# Patient Record
Sex: Female | Born: 1950 | Race: White | Hispanic: No | Marital: Single
Health system: Southern US, Community
[De-identification: ages and names within clinical notes are randomized; demographics above are authoritative.]

---

## 2001-05-19 ENCOUNTER — Encounter: Payer: Self-pay | Admitting: Family Medicine

## 2001-05-19 ENCOUNTER — Ambulatory Visit (HOSPITAL_COMMUNITY): Admission: RE | Admit: 2001-05-19 | Discharge: 2001-05-19 | Payer: Self-pay | Admitting: Family Medicine

## 2001-08-17 ENCOUNTER — Encounter: Payer: Self-pay | Admitting: Family Medicine

## 2001-08-17 ENCOUNTER — Ambulatory Visit (HOSPITAL_COMMUNITY): Admission: RE | Admit: 2001-08-17 | Discharge: 2001-08-17 | Payer: Self-pay | Admitting: Family Medicine

## 2001-08-29 ENCOUNTER — Encounter: Payer: Self-pay | Admitting: Family Medicine

## 2001-08-29 ENCOUNTER — Ambulatory Visit (HOSPITAL_COMMUNITY): Admission: RE | Admit: 2001-08-29 | Discharge: 2001-08-29 | Payer: Self-pay | Admitting: Family Medicine

## 2002-09-22 ENCOUNTER — Encounter: Payer: Self-pay | Admitting: Family Medicine

## 2002-09-22 ENCOUNTER — Ambulatory Visit (HOSPITAL_COMMUNITY): Admission: RE | Admit: 2002-09-22 | Discharge: 2002-09-22 | Payer: Self-pay | Admitting: Family Medicine

## 2002-10-02 ENCOUNTER — Encounter: Payer: Self-pay | Admitting: Family Medicine

## 2002-10-02 ENCOUNTER — Ambulatory Visit (HOSPITAL_COMMUNITY): Admission: RE | Admit: 2002-10-02 | Discharge: 2002-10-02 | Payer: Self-pay | Admitting: Family Medicine

## 2002-10-04 ENCOUNTER — Ambulatory Visit (HOSPITAL_COMMUNITY): Admission: RE | Admit: 2002-10-04 | Discharge: 2002-10-04 | Payer: Self-pay | Admitting: Internal Medicine

## 2002-10-04 ENCOUNTER — Encounter (INDEPENDENT_AMBULATORY_CARE_PROVIDER_SITE_OTHER): Payer: Self-pay | Admitting: Internal Medicine

## 2002-10-13 ENCOUNTER — Observation Stay (HOSPITAL_COMMUNITY): Admission: RE | Admit: 2002-10-13 | Discharge: 2002-10-14 | Payer: Self-pay | Admitting: General Surgery

## 2002-10-13 ENCOUNTER — Encounter: Payer: Self-pay | Admitting: General Surgery

## 2003-02-09 ENCOUNTER — Ambulatory Visit (HOSPITAL_COMMUNITY): Admission: RE | Admit: 2003-02-09 | Discharge: 2003-02-09 | Payer: Self-pay | Admitting: Family Medicine

## 2003-02-09 ENCOUNTER — Encounter: Payer: Self-pay | Admitting: Family Medicine

## 2003-02-19 ENCOUNTER — Ambulatory Visit (HOSPITAL_COMMUNITY): Admission: RE | Admit: 2003-02-19 | Discharge: 2003-02-19 | Payer: Self-pay | Admitting: General Surgery

## 2003-04-09 ENCOUNTER — Ambulatory Visit (HOSPITAL_COMMUNITY): Admission: RE | Admit: 2003-04-09 | Discharge: 2003-04-09 | Payer: Self-pay | Admitting: Internal Medicine

## 2003-09-26 ENCOUNTER — Ambulatory Visit (HOSPITAL_COMMUNITY): Admission: RE | Admit: 2003-09-26 | Discharge: 2003-09-26 | Payer: Self-pay | Admitting: Family Medicine

## 2004-03-01 ENCOUNTER — Inpatient Hospital Stay (HOSPITAL_COMMUNITY): Admission: EM | Admit: 2004-03-01 | Discharge: 2004-03-04 | Payer: Self-pay | Admitting: Neurosurgery

## 2004-04-16 ENCOUNTER — Observation Stay (HOSPITAL_COMMUNITY): Admission: RE | Admit: 2004-04-16 | Discharge: 2004-04-18 | Payer: Self-pay | Admitting: Neurosurgery

## 2004-05-21 ENCOUNTER — Encounter (HOSPITAL_COMMUNITY): Admission: RE | Admit: 2004-05-21 | Discharge: 2004-06-20 | Payer: Self-pay | Admitting: Neurosurgery

## 2004-08-28 ENCOUNTER — Ambulatory Visit (HOSPITAL_COMMUNITY): Admission: RE | Admit: 2004-08-28 | Discharge: 2004-08-28 | Payer: Self-pay | Admitting: Family Medicine

## 2004-09-04 ENCOUNTER — Ambulatory Visit (HOSPITAL_COMMUNITY): Admission: RE | Admit: 2004-09-04 | Discharge: 2004-09-04 | Payer: Self-pay | Admitting: Neurosurgery

## 2004-09-17 ENCOUNTER — Encounter: Admission: RE | Admit: 2004-09-17 | Discharge: 2004-09-17 | Payer: Self-pay | Admitting: Neurosurgery

## 2004-09-26 ENCOUNTER — Inpatient Hospital Stay (HOSPITAL_COMMUNITY): Admission: RE | Admit: 2004-09-26 | Discharge: 2004-09-30 | Payer: Self-pay | Admitting: Neurosurgery

## 2004-12-31 ENCOUNTER — Encounter (HOSPITAL_COMMUNITY): Admission: RE | Admit: 2004-12-31 | Discharge: 2005-02-06 | Payer: Self-pay | Admitting: Neurosurgery

## 2005-02-10 ENCOUNTER — Encounter (HOSPITAL_COMMUNITY): Admission: RE | Admit: 2005-02-10 | Discharge: 2005-03-12 | Payer: Self-pay | Admitting: Neurosurgery

## 2005-08-25 ENCOUNTER — Ambulatory Visit (HOSPITAL_COMMUNITY): Admission: RE | Admit: 2005-08-25 | Discharge: 2005-08-25 | Payer: Self-pay | Admitting: Family Medicine

## 2005-09-08 ENCOUNTER — Ambulatory Visit (HOSPITAL_COMMUNITY): Admission: RE | Admit: 2005-09-08 | Discharge: 2005-09-08 | Payer: Self-pay | Admitting: Family Medicine

## 2005-10-22 ENCOUNTER — Ambulatory Visit (HOSPITAL_COMMUNITY): Admission: RE | Admit: 2005-10-22 | Discharge: 2005-10-22 | Payer: Self-pay | Admitting: Neurosurgery

## 2008-03-13 IMAGING — CT CT L SPINE W/ CM
3 of 8 series · 15 of 33 positions shown, 18 images · IV contrast (omnipaque)
Comparison: none

CLINICAL DATA: Back and right leg pain.
LUMBAR MYELOGRAM: 
Lumbar puncture was performed by Dr. Kotimah at L2.  15 cc of Omnipaque 180 was instilled into the subarachnoid space.  AP, lateral and oblique views show posterior interspace fusion device at L4-5.  There is interbody tangent spacer at L4-5 ventrally.  There is a mild bulge at L3-4.  No nerve root cut-off is seen.  Flexion and extension could not be performed.

[Series 4: 2mm axial soft tissue · axial · 0.25mm/px · z∈[+846,+988]mm · 7 of 94 slices shown, 9 images]
[im 12/94  soft-tissue]
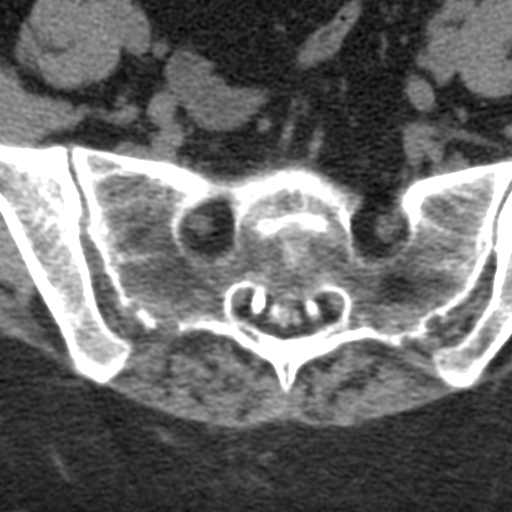
[im 12/94  bone]
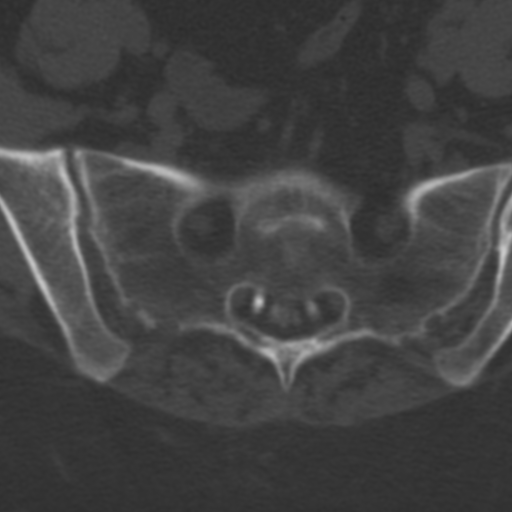
[im 24/94  bone]
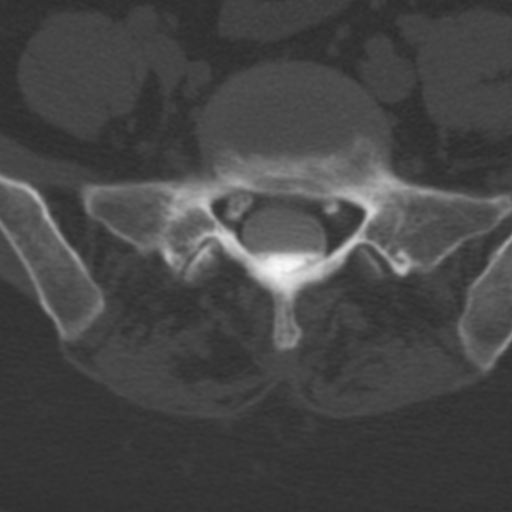
[im 35/94  bone]
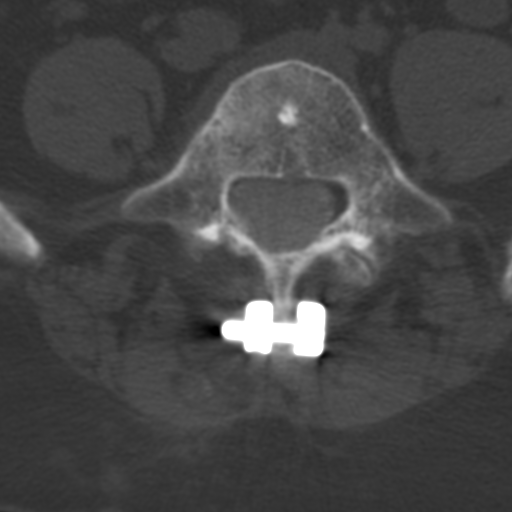
[im 47/94  bone]
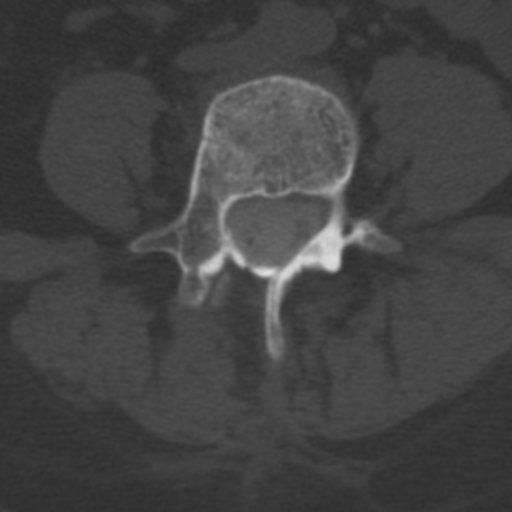
[im 59/94  soft-tissue]
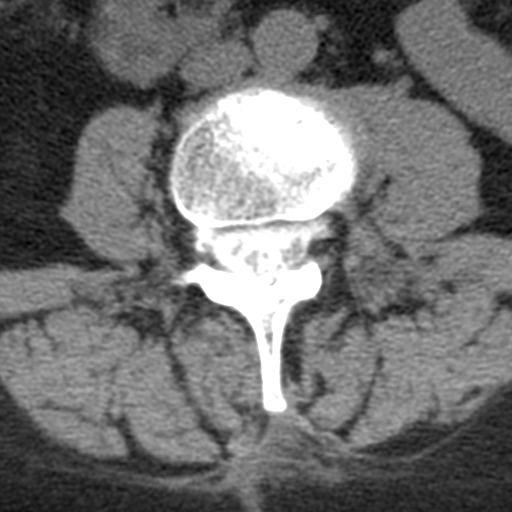
[im 59/94  bone]
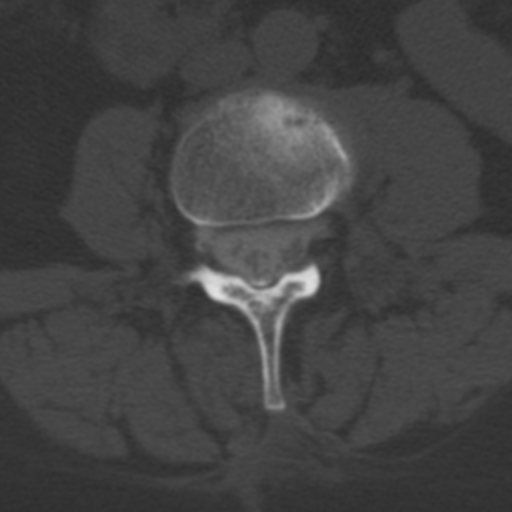
[im 70/94  bone]
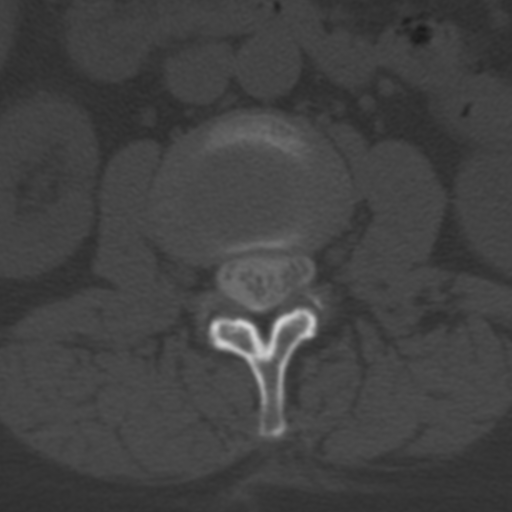
[im 82/94  bone]
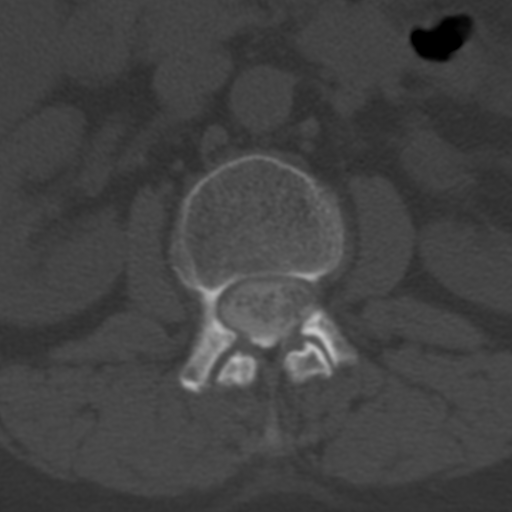

[Series 604: cor thins st · coronal · 0.38mm/px · 3 of 45 slices shown]
[im 9/45  bone]
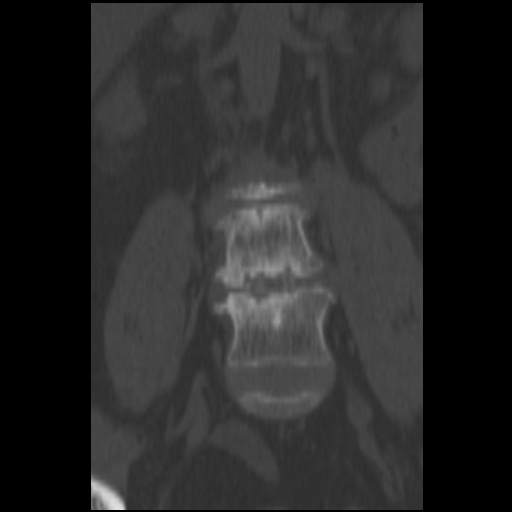
[im 18/45  bone]
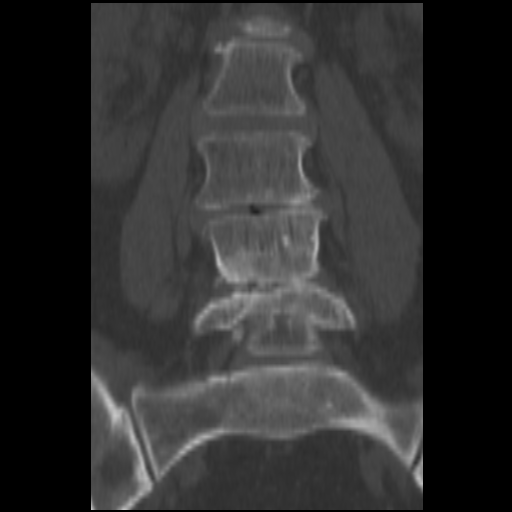
[im 27/45  bone]
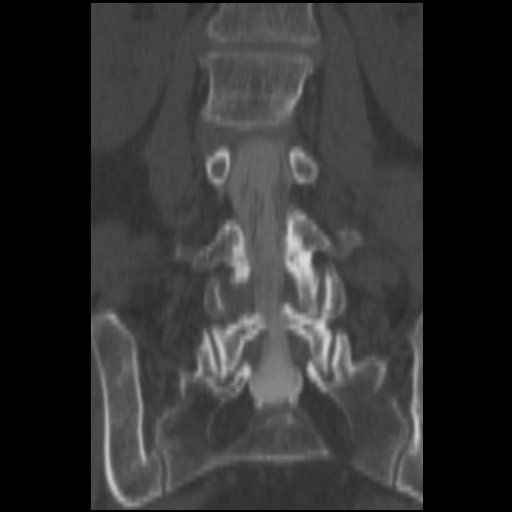

[Series 605: sag thins st · sagittal · 0.38mm/px · 5 of 39 slices shown, 6 images]
[im 13/39  bone]
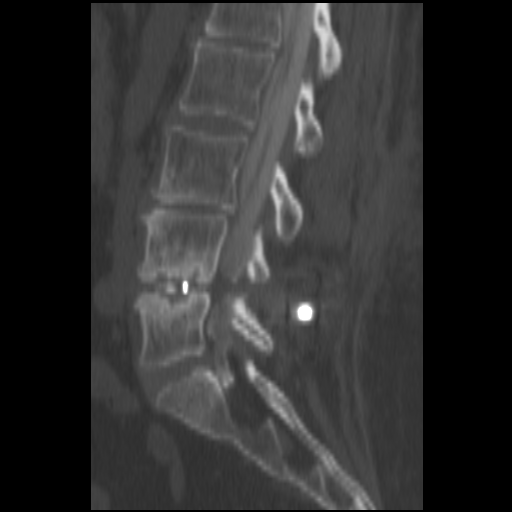
[im 16/39  bone]
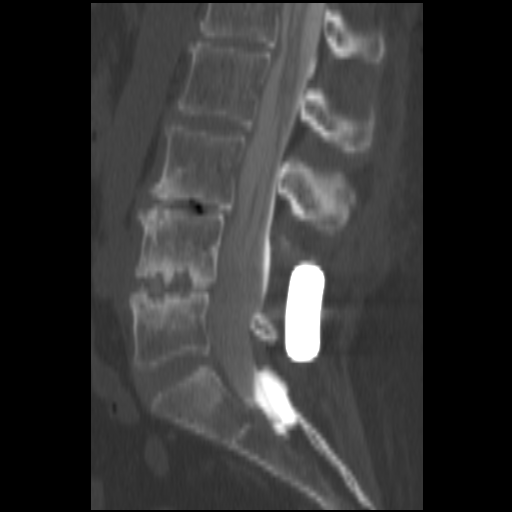
[im 20/39  soft-tissue]
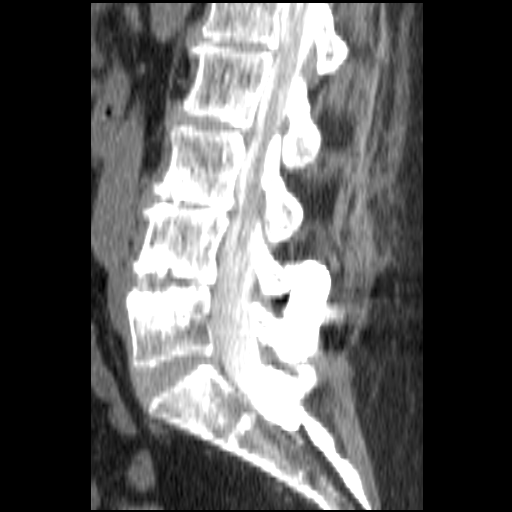
[im 20/39  bone]
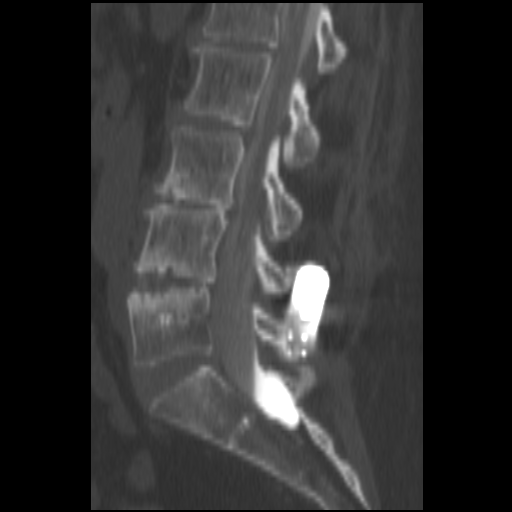
[im 23/39  bone]
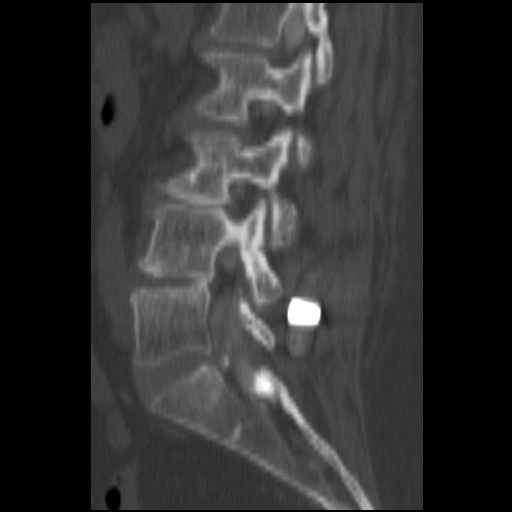
[im 26/39  bone]
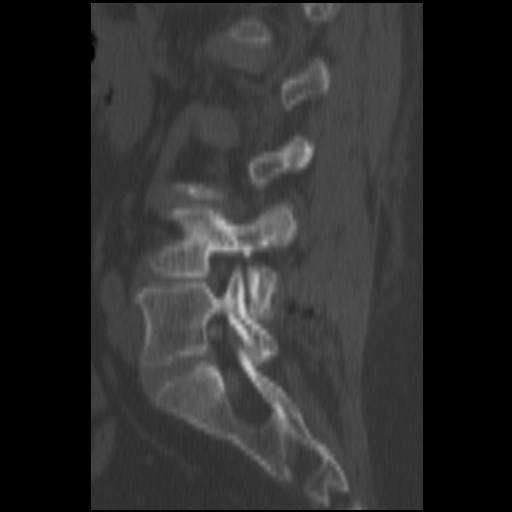

[15 of 33 positions shown; findings below may reference images not displayed]

IMPRESSION: L4-5 post-surgical changes as described. 
POST-MYELOGRAM CT: 
L1-2:  Disk space narrowing, mild bulge.  No stenosis or disk protrusion. 
L2-3:  Disk space narrowing with mild bulge.   Asymmetric lateral recess encroachment on the left due to ligamentum flavum hypertrophy.  Possible mild left L3 nerve root encroachment is present. 
L3-4:  Advanced disk space narrowing with vacuum disk phenomenon.  Central osteophyte formation is present.  Mild facet hypertrophy noted.  No stenosis or disk protrusion.
L4-5:  Hemilaminotomy defect, right.  Tangent interbody spacer seen in the interspace.  Metallic fixation device posteriorly.  Evidence for nonunion with sclerotic endplates and no bony bridging across the interspace.  In addition, there is soft tissue density in the foramen likely representing a combination of disk material and granulation tissue compressing the right L4 nerve root.  Conceivably the right L5 nerve root may be irritated as well. 
L5-S1:   Advanced facet hypertrophy.  No stenosis or disk protrusion.
IMPRESSION: 1.  Nonunion at L4-5.
2.  Soft tissue in the foramen of uncertain significance compressing the right L4 nerve root and possibility irritating the right L5 nerve root.
3.  Vacuum disk phenomenon and disk space narrowing at L3-4 without stenosis or definite nerve root encroachment.  
4.  Asymmetric lateral recess encroachment L2-3, left, related to facet disease.  See comments above.

## 2010-05-31 ENCOUNTER — Encounter: Payer: Self-pay | Admitting: Family Medicine

## 2014-02-20 ENCOUNTER — Telehealth: Payer: Self-pay | Admitting: Internal Medicine

## 2014-02-21 NOTE — Telephone Encounter (Signed)
This is a duplicate note and question to Dr. Lenord FellersBaxley.  Closing this encounter and will document on the original request.
